# Patient Record
Sex: Male | Born: 1980 | Race: Black or African American | Hispanic: No | Marital: Married | Smoking: Never smoker
Health system: Southern US, Community
[De-identification: ages and names within clinical notes are randomized; demographics above are authoritative.]

## PROBLEM LIST (undated history)

## (undated) ENCOUNTER — Emergency Department (HOSPITAL_BASED_OUTPATIENT_CLINIC_OR_DEPARTMENT_OTHER): Payer: Self-pay

---

## 2006-12-25 ENCOUNTER — Emergency Department (HOSPITAL_COMMUNITY): Admission: EM | Admit: 2006-12-25 | Discharge: 2006-12-25 | Payer: Self-pay | Admitting: Emergency Medicine

## 2015-09-13 ENCOUNTER — Emergency Department (HOSPITAL_BASED_OUTPATIENT_CLINIC_OR_DEPARTMENT_OTHER)
Admission: EM | Admit: 2015-09-13 | Discharge: 2015-09-13 | Disposition: A | Discharge status: Home / Self Care | Payer: BLUE CROSS/BLUE SHIELD | Attending: Emergency Medicine | Admitting: Emergency Medicine

## 2015-09-13 ENCOUNTER — Encounter (HOSPITAL_BASED_OUTPATIENT_CLINIC_OR_DEPARTMENT_OTHER): Payer: Self-pay | Admitting: *Deleted

## 2015-09-13 ENCOUNTER — Emergency Department (HOSPITAL_BASED_OUTPATIENT_CLINIC_OR_DEPARTMENT_OTHER): Payer: BLUE CROSS/BLUE SHIELD

## 2015-09-13 DIAGNOSIS — J208 Acute bronchitis due to other specified organisms: Secondary | ICD-10-CM | POA: Diagnosis not present

## 2015-09-13 DIAGNOSIS — R05 Cough: Secondary | ICD-10-CM | POA: Diagnosis present

## 2015-09-13 LAB — RAPID STREP SCREEN (MED CTR MEBANE ONLY): STREPTOCOCCUS, GROUP A SCREEN (DIRECT): NEGATIVE

## 2015-09-13 MED ORDER — ALBUTEROL SULFATE HFA 108 (90 BASE) MCG/ACT IN AERS: 2.0000 | INHALATION_SPRAY | RESPIRATORY_TRACT | Status: AC | PRN

## 2015-09-13 MED FILL — VENTOLIN HFA 90 MCG INHALER: 108 (90 BAS | 30 days supply | Qty: 18 | Fill #0

## 2015-09-13 NOTE — ED Provider Notes (Signed)
CSN: [id number]     Arrival date & time 09/13/15  0914 History   First MD Initiated Contact with Patient 09/13/15 1023     No chief complaint on file.    (Consider location/radiation/quality/duration/timing/severity/associated sxs/prior Treatment) HPI 35 year old otherwise healthy male who presents with sore throat and cough. Ill since last week with sore throat, coughing, headache, sneezing, and congestion. With coughing states sharp muscles pains through arms, chest wall and back. No fever, chills, dysuria, nausea, vomiting or diarrhea. Children sick at home with similar illness.   History reviewed. No pertinent past medical history. History reviewed. No pertinent past surgical history. History reviewed. No pertinent family history. Social History  Substance Use Topics  . Smoking status: Never Smoker   . Smokeless tobacco: None  . Alcohol Use: None    Review of Systems  Constitutional: Negative for fever.  HENT: Positive for sneezing.   Respiratory: Positive for cough.   Gastrointestinal: Negative for abdominal pain.      Allergies  Review of patient's allergies indicates no known allergies.  Home Medications   Prior to Admission medications   Medication Sig Start Date End Date Taking? Authorizing Provider  albuterol (PROVENTIL HFA;VENTOLIN HFA) 108 (90 Base) MCG/ACT inhaler Inhale 2 puffs into the lungs every 4 (four) hours as needed for wheezing or shortness of breath. 09/13/15   Lavera Guiseana Duo Fain Francis, MD   BP 143/87 mmHg  Pulse 58  Temp(Src) 98 F (36.7 C) (Oral)  Resp 18  Ht 5\' 9"  (1.753 m)  Wt 190 lb (86.183 kg)  BMI 28.05 kg/m2  SpO2 100% Physical Exam Physical Exam  Nursing note and vitals reviewed. Constitutional: Well developed, well nourished, non-toxic, and in no acute distress Head: Normocephalic and atraumatic.  Mouth/Throat: Oropharynx is erythematous but no exudates or swelling and moist.  Neck: Normal range of motion. Neck supple.  Cardiovascular: Normal  rate and regular rhythm.   Pulmonary/Chest: Effort normal and breath sounds normal. with bronchospastic cough Abdominal: Soft. There is no tenderness. There is no rebound and no guarding.  Musculoskeletal: Normal range of motion.  Neurological: Alert, no facial droop, fluent speech, moves all extremities symmetrically Skin: Skin is warm and dry.  Psychiatric: Cooperative  ED Course  Procedures (including critical care time) Labs Review Labs Reviewed  RAPID STREP SCREEN (NOT AT [facility])  CULTURE, GROUP A STREP    Imaging Review Dg Chest 2 View  09/13/2015  CLINICAL DATA:  Cough. EXAM: CHEST  2 VIEW COMPARISON:  None. FINDINGS: The heart size and mediastinal contours are within normal limits. Both lungs are clear. No pneumothorax or pleural effusion is noted. The visualized skeletal structures are unremarkable. IMPRESSION: No active cardiopulmonary disease. Electronically Signed   By: Lupita RaiderJames  Green Jr, M.D.   On: 09/13/2015 10:05   I have personally reviewed and evaluated these images and lab results as part of my medical decision-making.   EKG Interpretation None      MDM   Final diagnoses:  Viral bronchitis    Well-appearing and otherwise healthy 35 year old male who presents with sore throat, cough, sneezing and other upper respiratory symptoms that are consistent with likely viral respiratory illness. Chest x-ray without acute cardiopulmonary processes. Strep swab negative for pharyngitis. Discussed supportive care for home. With bronchospastic cough and so we'll give a prescription for albuterol. Strict return and follow-up instructions are reviewed. He expressed understanding of all discharge instructions and felt comfortable with the plan of care.    Lavera Guiseana Duo Isaiah Cianci, MD 09/13/15 1045

## 2015-09-13 NOTE — ED Notes (Signed)
C/o sorethroat and h/a with coughing with thick brown/yellow mucus. Onset last Wednesday.

## 2015-09-13 NOTE — Discharge Instructions (Signed)
Take motrin and tylenol as needed for pain. Use albuterol as needed for cough and difficulty breathing. Return for worsening symptoms, including difficulty breathing, confusion, persistent fevers, or any other symptoms concerning to you.  Acute Bronchitis Bronchitis is inflammation of the airways that extend from the windpipe into the lungs (bronchi). The inflammation often causes mucus to develop. This leads to a cough, which is the most common symptom of bronchitis.  In acute bronchitis, the condition usually develops suddenly and goes away over time, usually in a couple weeks. Smoking, allergies, and asthma can make bronchitis worse. Repeated episodes of bronchitis may cause further lung problems.  CAUSES Acute bronchitis is most often caused by the same virus that causes a cold. The virus can spread from person to person (contagious) through coughing, sneezing, and touching contaminated objects. SIGNS AND SYMPTOMS   Cough.   Fever.   Coughing up mucus.   Body aches.   Chest congestion.   Chills.   Shortness of breath.   Sore throat.  DIAGNOSIS  Acute bronchitis is usually diagnosed through a physical exam. Your health care provider will also ask you questions about your medical history. Tests, such as chest X-rays, are sometimes done to rule out other conditions.  TREATMENT  Acute bronchitis usually goes away in a couple weeks. Oftentimes, no medical treatment is necessary. Medicines are sometimes given for relief of fever or cough. Antibiotic medicines are usually not needed but may be prescribed in certain situations. In some cases, an inhaler may be recommended to help reduce shortness of breath and control the cough. A cool mist vaporizer may also be used to help thin bronchial secretions and make it easier to clear the chest.  HOME CARE INSTRUCTIONS  Get plenty of rest.   Drink enough fluids to keep your urine clear or pale yellow (unless you have a medical condition  that requires fluid restriction). Increasing fluids may help thin your respiratory secretions (sputum) and reduce chest congestion, and it will prevent dehydration.   Take medicines only as directed by your health care provider.  If you were prescribed an antibiotic medicine, finish it all even if you start to feel better.  Avoid smoking and secondhand smoke. Exposure to cigarette smoke or irritating chemicals will make bronchitis worse. If you are a smoker, consider using nicotine gum or skin patches to help control withdrawal symptoms. Quitting smoking will help your lungs heal faster.   Reduce the chances of another bout of acute bronchitis by washing your hands frequently, avoiding people with cold symptoms, and trying not to touch your hands to your mouth, nose, or eyes.   Keep all follow-up visits as directed by your health care provider.  SEEK MEDICAL CARE IF: Your symptoms do not improve after 1 week of treatment.  SEEK IMMEDIATE MEDICAL CARE IF:  You develop an increased fever or chills.   You have chest pain.   You have severe shortness of breath.  You have bloody sputum.   You develop dehydration.  You faint or repeatedly feel like you are going to pass out.  You develop repeated vomiting.  You develop a severe headache. MAKE SURE YOU:   Understand these instructions.  Will watch your condition.  Will get help right away if you are not doing well or get worse.   This information is not intended to replace advice given to you by your health care provider. Make sure you discuss any questions you have with your health care provider.  Document Released: 10/03/2004 Document Revised: 09/16/2014 Document Reviewed: 02/16/2013 Elsevier Interactive Patient Education [employer].

## 2015-09-15 LAB — CULTURE, GROUP A STREP: STREP A CULTURE: NEGATIVE

## 2017-01-11 IMAGING — DX DG CHEST 2V
2 series · 2 of 2 positions shown · non-contrast
Comparison: None.

CLINICAL DATA: Cough.

EXAM:
CHEST  2 VIEW

[chest pa]
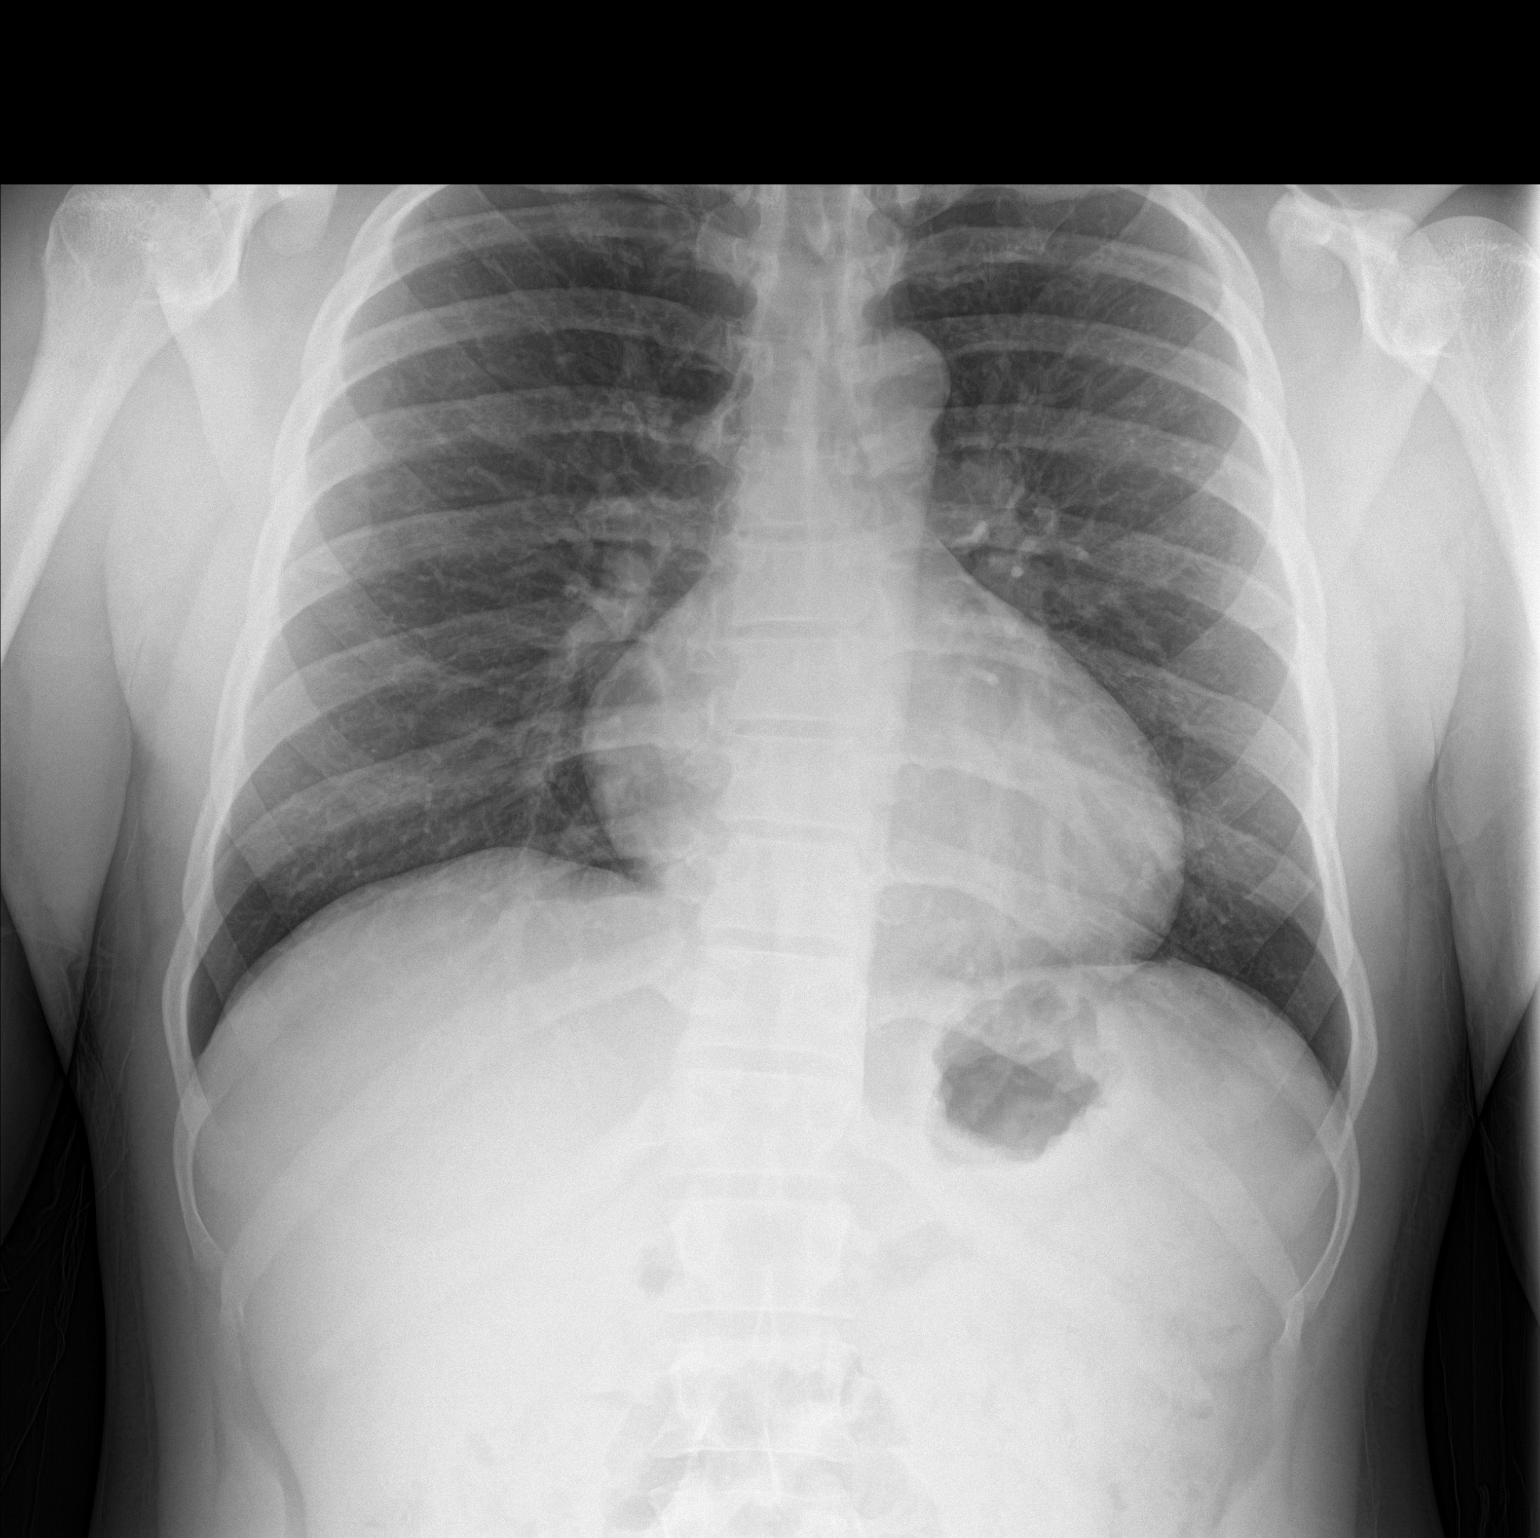

[chest lat]
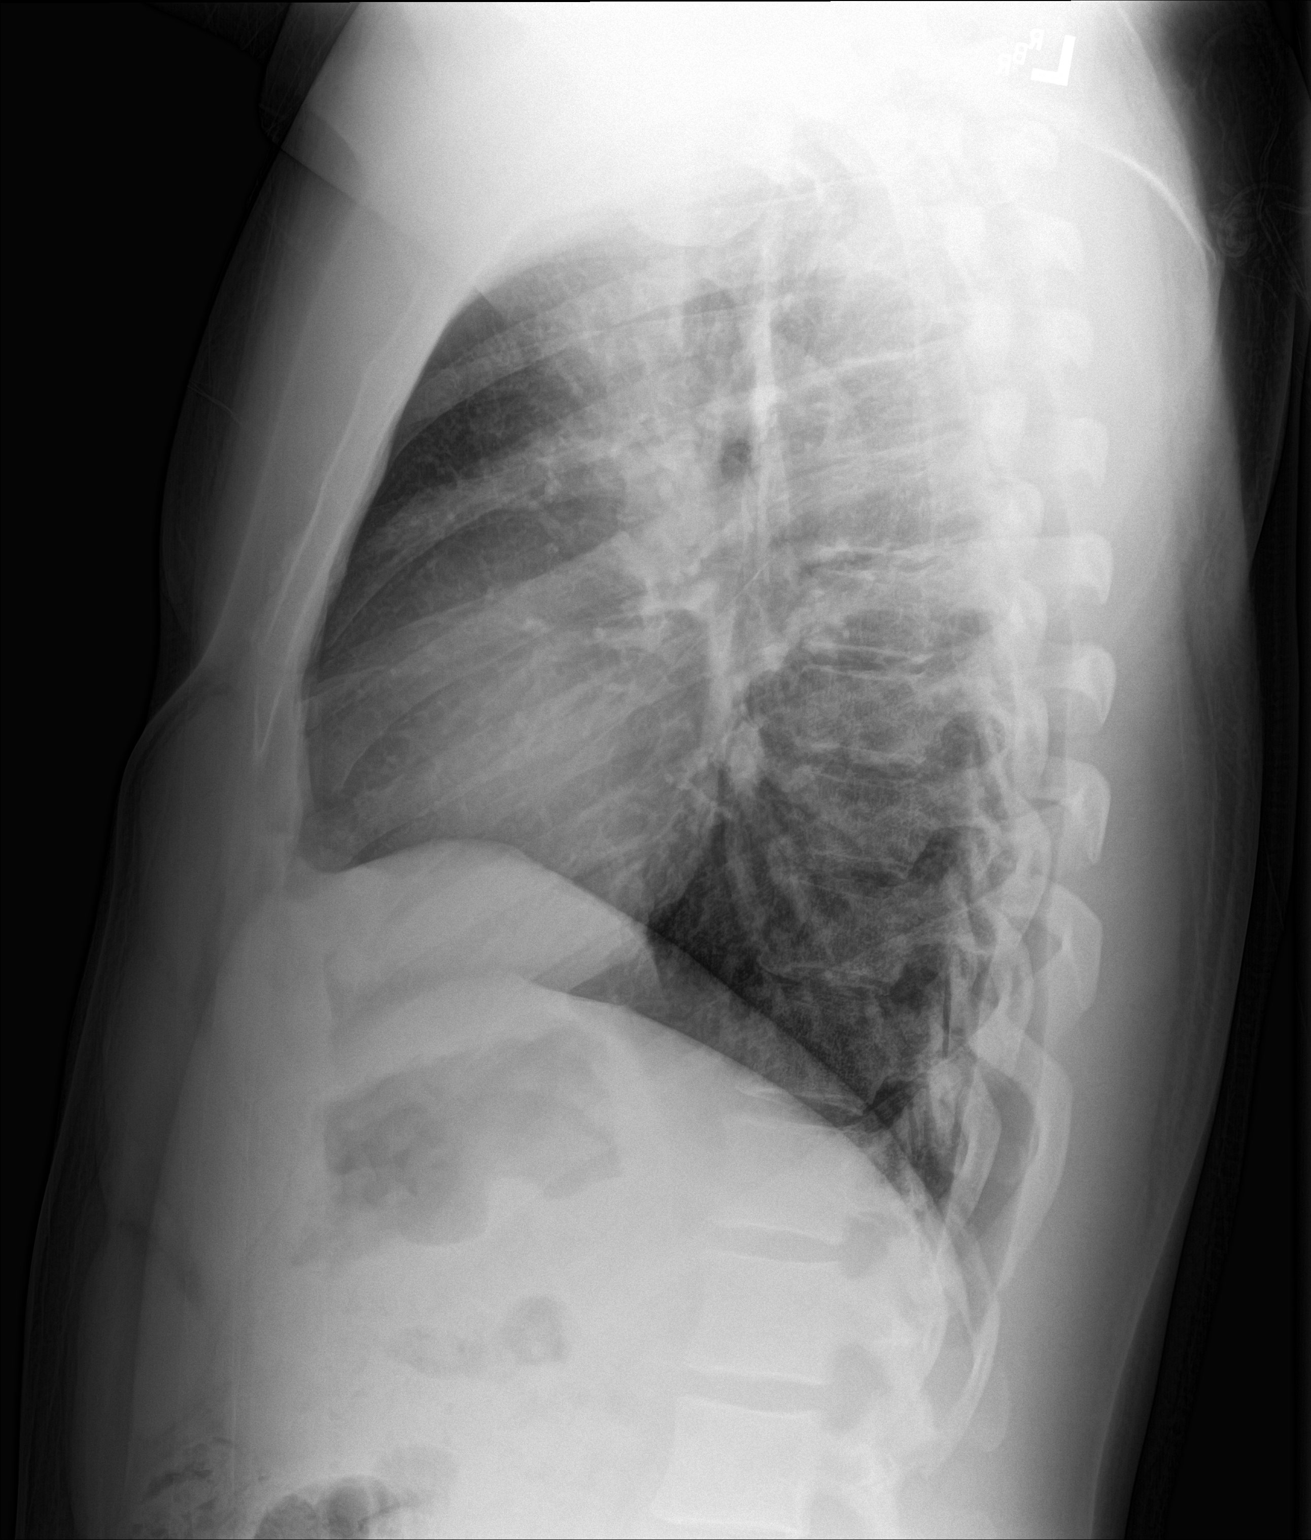

[2 of 2 positions shown; findings below may reference images not displayed]

FINDINGS: The heart size and mediastinal contours are within normal limits.
Both lungs are clear. No pneumothorax or pleural effusion is noted.
The visualized skeletal structures are unremarkable.
IMPRESSION: No active cardiopulmonary disease.

## 2023-11-11 ENCOUNTER — Encounter (HOSPITAL_BASED_OUTPATIENT_CLINIC_OR_DEPARTMENT_OTHER): Payer: Self-pay | Admitting: Emergency Medicine

## 2023-11-11 ENCOUNTER — Other Ambulatory Visit: Payer: Self-pay

## 2023-11-11 ENCOUNTER — Emergency Department (HOSPITAL_BASED_OUTPATIENT_CLINIC_OR_DEPARTMENT_OTHER)
Admission: EM | Admit: 2023-11-11 | Discharge: 2023-11-11 | Disposition: A | Discharge status: Home / Self Care | Payer: Self-pay | Attending: Emergency Medicine | Admitting: Emergency Medicine

## 2023-11-11 ENCOUNTER — Emergency Department (HOSPITAL_BASED_OUTPATIENT_CLINIC_OR_DEPARTMENT_OTHER): Payer: Self-pay

## 2023-11-11 DIAGNOSIS — R55 Syncope and collapse: Secondary | ICD-10-CM | POA: Insufficient documentation

## 2023-11-11 LAB — BASIC METABOLIC PANEL
Anion gap: 11 (ref 5–15)
BUN: 12 mg/dL (ref 6–20)
CO2: 27 mmol/L (ref 22–32)
Calcium: 9.5 mg/dL (ref 8.9–10.3)
Chloride: 93 mmol/L — ABNORMAL LOW (ref 98–111)
Creatinine, Ser: 1.19 mg/dL (ref 0.61–1.24)
GFR, Estimated: >60 mL/min (ref >60)
Glucose, Bld: 96 mg/dL (ref 70–99)
Potassium: 3.3 mmol/L — ABNORMAL LOW (ref 3.5–5.1)
Sodium: 131 mmol/L — ABNORMAL LOW (ref 135–145)

## 2023-11-11 LAB — URINALYSIS, ROUTINE W REFLEX MICROSCOPIC
Glucose, UA: NEGATIVE mg/dL
Hgb urine dipstick: NEGATIVE
Ketones, ur: NEGATIVE mg/dL
Leukocytes,Ua: NEGATIVE
Nitrite: NEGATIVE
Protein, ur: 30 mg/dL — AB
Specific Gravity, Urine: 1.02 (ref 1.005–1.030)
pH: 5.5 (ref 5.0–8.0)

## 2023-11-11 LAB — CBC
HCT: 39.3 % (ref 39.0–52.0)
Hemoglobin: 13.2 g/dL (ref 13.0–17.0)
MCH: 27.2 pg (ref 26.0–34.0)
MCHC: 33.6 g/dL (ref 30.0–36.0)
MCV: 81 fL (ref 80.0–100.0)
Platelets: 230 10*3/uL (ref 150–400)
RBC: 4.85 MIL/uL (ref 4.22–5.81)
RDW: 12 % (ref 11.5–15.5)
WBC: 2.3 10*3/uL — ABNORMAL LOW (ref 4.0–10.5)
nRBC: 0 % (ref 0.0–0.2)

## 2023-11-11 LAB — URINALYSIS, MICROSCOPIC (REFLEX)

## 2023-11-11 LAB — CBG MONITORING, ED: Glucose-Capillary: 86 mg/dL (ref 70–99)

## 2023-11-11 LAB — TROPONIN I (HIGH SENSITIVITY): Troponin I (High Sensitivity): 5 ng/L (ref <18)

## 2023-11-11 NOTE — ED Provider Notes (Signed)
[place] [facility] Provider Note   CSN: [id number] Arrival date & time: 11/11/23  1058     History  Chief Complaint  Patient presents with   Loss of Consciousness    Stanley Nunez is a 43 y.o. male.  Patient reports he was sitting in the car yesterday and became warm and sweaty and passed out.  Patient reports he had a similar episode today and felt as if he were passing out.  Patient reports that he felt so bad he did not feel like he could go to work.  Patient denies having any chest pain he is not having any shortness of breath.  Patient states that he did not fall he did not injure himself.  Patient states he was not driving.  Patient reports that he has been eating less trying to lose weight.  Patient feels like he has been drinking plenty of fluids.  He denies any nausea vomiting or diarrhea.  Patient has not had a headache he has not had any chest pain.  Patient denies any abdominal pain   Loss of Consciousness      Home Medications Prior to Admission medications   Medication Sig Start Date End Date Taking? Authorizing Provider  albuterol (PROVENTIL HFA;VENTOLIN HFA) 108 (90 Base) MCG/ACT inhaler Inhale 2 puffs into the lungs every 4 (four) hours as needed for wheezing or shortness of breath. 09/13/15   Lavera Guise, MD      Allergies    Patient has no known allergies.    Review of Systems   Review of Systems  Cardiovascular:  Positive for syncope.  All other systems reviewed and are negative.   Physical Exam Updated Vital Signs BP 132/75   Pulse (!) 52   Temp 97.9 F (36.6 C)   Resp 18   Ht 5\' 9"  (1.753 m)   Wt 77.1 kg   SpO2 100%   BMI 25.10 kg/m  Physical Exam Vitals and nursing note reviewed.  Constitutional:      Appearance: He is well-developed.  HENT:     Head: Normocephalic.     Right Ear: Tympanic membrane normal.     Left Ear: Tympanic membrane normal.     Nose: Nose normal.     Mouth/Throat:      Mouth: Mucous membranes are moist.  Cardiovascular:     Rate and Rhythm: Normal rate.  Pulmonary:     Effort: Pulmonary effort is normal.  Abdominal:     General: Abdomen is flat. There is no distension.  Musculoskeletal:        General: Normal range of motion.     Cervical back: Normal range of motion.  Skin:    General: Skin is warm.  Neurological:     General: No focal deficit present.     Mental Status: He is alert and oriented to person, place, and time.  Psychiatric:        Judgment: Judgment normal.     ED Results / Procedures / Treatments   Labs (all labs ordered are listed, but only abnormal results are displayed) Labs Reviewed  CBC - Abnormal; Notable for the following components:      Result Value   WBC 2.3 (*)    All other components within normal limits  URINALYSIS, ROUTINE W REFLEX MICROSCOPIC - Abnormal; Notable for the following components:   APPearance HAZY (*)    Bilirubin Urine SMALL (*)    Protein, ur 30 (*)  All other components within normal limits  BASIC METABOLIC PANEL - Abnormal; Notable for the following components:   Sodium 131 (*)    Potassium 3.3 (*)    Chloride 93 (*)    All other components within normal limits  URINALYSIS, MICROSCOPIC (REFLEX) - Abnormal; Notable for the following components:   Bacteria, UA RARE (*)    All other components within normal limits  CBG MONITORING, ED  TROPONIN I (HIGH SENSITIVITY)    EKG EKG Interpretation Date/Time:  Tuesday November 11 2023 11:07:37 EST Ventricular Rate:  60 PR Interval:  153 QRS Duration:  82 QT Interval:  375 QTC Calculation: 375 R Axis:   73  Text Interpretation: Sinus rhythm diffuse ST elevation, likely early repol No old tracing to compare Confirmed by Pricilla Loveless [phone]) on 11/11/2023 11:21:07 AM  Radiology CT Head Wo Contrast Result Date: 11/11/2023 CLINICAL DATA:  Syncope/presyncope, cerebrovascular cause suspected. EXAM: CT HEAD WITHOUT CONTRAST TECHNIQUE: Contiguous  axial images were obtained from the base of the skull through the vertex without intravenous contrast. RADIATION DOSE REDUCTION: This exam was performed according to the departmental dose-optimization program which includes automated exposure control, adjustment of the mA and/or kV according to patient size and/or use of iterative reconstruction technique. COMPARISON:  None Available. FINDINGS: Brain: The brain shows a normal appearance without evidence of malformation, atrophy, old or acute small or large vessel infarction, mass lesion, hemorrhage, hydrocephalus or extra-axial collection. Vascular: No hyperdense vessel. No evidence of atherosclerotic calcification. Skull: Normal.  No traumatic finding.  No focal bone lesion. Sinuses/Orbits: Sinuses are clear. Orbits appear normal. Mastoids are clear. Other: None significant IMPRESSION: Normal head CT. Electronically Signed   By: Paulina Fusi M.D.   On: 11/11/2023 15:33    Procedures Procedures    Medications Ordered in ED Medications - No data to display  ED Course/ Medical Decision Making/ A&P                                 Medical Decision Making Patient reports that he passed out yesterday and felt as if he was going to pass out today.  Amount and/or Complexity of Data Reviewed Independent Historian:     Details: Patient is here with family who is supportive Labs: ordered. Decision-making details documented in ED Course.    Details: Labs ordered reviewed and interpreted.  Potassium is 3.3 Radiology: ordered and independent interpretation performed. Decision-making details documented in ED Course.    Details: CT head no acute abnormality ECG/medicine tests: ordered and independent interpretation performed. Decision-making details documented in ED Course.    Details: EKG shows sinus bradycardia probably early repull.  Risk Risk Details: EKG reviewed with Dr. Criss Alvine no acute findings Patient looks well overall CT is normal. History  does not sound as if patient had a seizure. I doubt cardiac syncope. Patient is encouraged to eat drink rest he is given a note for work for the next 3 days.  He is advised to return to the emergency department if symptoms worsen or change.           Final Clinical Impression(s) / ED Diagnoses Final diagnoses:  Syncope and collapse    Rx / DC Orders ED Discharge Orders     None      An After Visit Summary was printed and given to the patient.    Elson Areas, [place] 11/11/23 1819    Pricilla Loveless, MD  11/13/23 1026  

## 2023-11-11 NOTE — ED Notes (Signed)
 Discharge instructions reviewed with patient. Patient verbalizes understanding, no further questions at this time. Medications and follow up information provided. No acute distress noted at time of departure.

## 2023-11-11 NOTE — ED Triage Notes (Signed)
 Pt POV steady gait- reports sitting in car and had reported syncopal episode by kids in car.  Per pt, eyes rolled back, felt hot.  Reports he does not remember event.   Reports dizziness associated with hot flash last night. Denies dizziness at this time.   AOx4 at time of triage. Denies DM, denies epilepsy.  VAN neg.
